# Patient Record
Sex: Female | Born: 1990 | Race: Black or African American | Hispanic: No | Marital: Single | Smoking: Never smoker
Health system: Southern US, Community
[De-identification: ages and names within clinical notes are randomized; demographics above are authoritative.]

## PROBLEM LIST (undated history)

## (undated) ENCOUNTER — Emergency Department (HOSPITAL_COMMUNITY): Discharge status: Absent without leave | Payer: Self-pay

## (undated) DIAGNOSIS — K589 Irritable bowel syndrome without diarrhea: Secondary | ICD-10-CM

## (undated) DIAGNOSIS — L309 Dermatitis, unspecified: Secondary | ICD-10-CM

## (undated) HISTORY — DX: Irritable bowel syndrome, unspecified: K58.9

## (undated) HISTORY — DX: Dermatitis, unspecified: L30.9

---
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
## 2011-03-02 LAB — URINE MICROSCOPIC-ADD ON

## 2011-03-02 LAB — URINALYSIS, ROUTINE W REFLEX MICROSCOPIC
Bilirubin Urine: NEGATIVE
Hgb urine dipstick: NEGATIVE
Ketones, ur: 15 mg/dL — AB
Nitrite: NEGATIVE
Specific Gravity, Urine: 1.034 — ABNORMAL HIGH (ref 1.005–1.030)
Urobilinogen, UA: 0.2 mg/dL (ref 0.0–1.0)

## 2011-03-02 LAB — POCT PREGNANCY, URINE: Preg Test, Ur: NEGATIVE

## 2011-04-17 NOTE — Op Note (Signed)
NAME:  Marissa Solis, Marissa Solis                          ACCOUNT NO.:  [id number]   MEDICAL RECORD NO.:  [id number]                   PATIENT TYPE:  AMB   LOCATION:  DSC                                  FACILITY:  [facility]   PHYSICIAN:  Leonia Corona, M.D.               DATE OF BIRTH:  Jun 13, 1991   DATE OF PROCEDURE:  03/15/2003  DATE OF DISCHARGE:                                 OPERATIVE REPORT   PREOPERATIVE DIAGNOSIS:  Left ear lobule cyst.   POSTOPERATIVE DIAGNOSIS:  Foreign body granuloma left ear lobule.   PROCEDURE:  Excision biopsy of left ear lobule granuloma.   ANESTHESIA:  General laryngeal mask anesthesia.   SURGEON:  Leonia Corona, M.D.   ASSISTANT:  Nurse.   INDICATIONS FOR PROCEDURE:  This 20 year old female was seen in my office  for complaints of a painful, nodular swelling of the left ear lobule which  continued to grow in size over the last few months.  There is a history of  ear piercing on that side.  Clinically, this appeared like a 1 cm growing  cyst, hence the indication for the procedure.   DESCRIPTION OF PROCEDURE:  The patient is brought into the operating room  and placed supine on the operating table.  General laryngeal mask anesthesia  is given. The left ear and the surrounding area of this space is cleaned,  prepped and draped in the usual sterile fashion.  The incision was marked at  the base of the cyst on the ear lobule.  An incision was made with knife  superficially and dissected with the help of sharp scissors, freeing the  base of the lobular swelling from the surrounding fibrous tissue.  Traction  was applied to the cyst and dissection was continued.  It appeared to be  continuing in the form of a sinus tract running from posterior surface of  the ear lobule to the anterior surface and putting traction on the whole in  the lobule, indicating sinus tract running through the granulomatous tissue.  Therefore, an elliptical incision was also mass  in the front of the ear  lobule, surrounding the hole in the ear lobule.  Once separated, the entire  tract along with the granuloma was removed from the field.  Oozing and  bleeding was controlled with gentle pressure for a few minutes.  Approximately 2 mL of 0.25% Marcaine with epinephrine was infiltrated in and  around the incision for postoperative pain control.  The wound was  closed from front to back using 6-0 Prolene interrupted sutures.  A sterile  gauze and clear tape dressing was applied. The patient tolerated the  procedure very well which was smooth and uneventful. The patient was later  extubated and transported to the recovery room in good stable condition.  Leonia Corona, M.D.    SF/MEDQ  D:  03/15/2003  T:  03/15/2003  Job:  [id number]   cc:   Sharlot Gowda, M.D.  [address] [place], [place] [address]  Fax: [phone]

## 2011-05-27 IMAGING — CR DG ABDOMEN ACUTE W/ 1V CHEST
3 series · 3 of 3 positions shown · non-contrast
Comparison: 07/04/2006

CLINICAL DATA: Nausea, vomiting, dehydration, diarrhea

ACUTE ABDOMEN SERIES (ABDOMEN 2 VIEW & CHEST 1 VIEW)

[w chest pa]
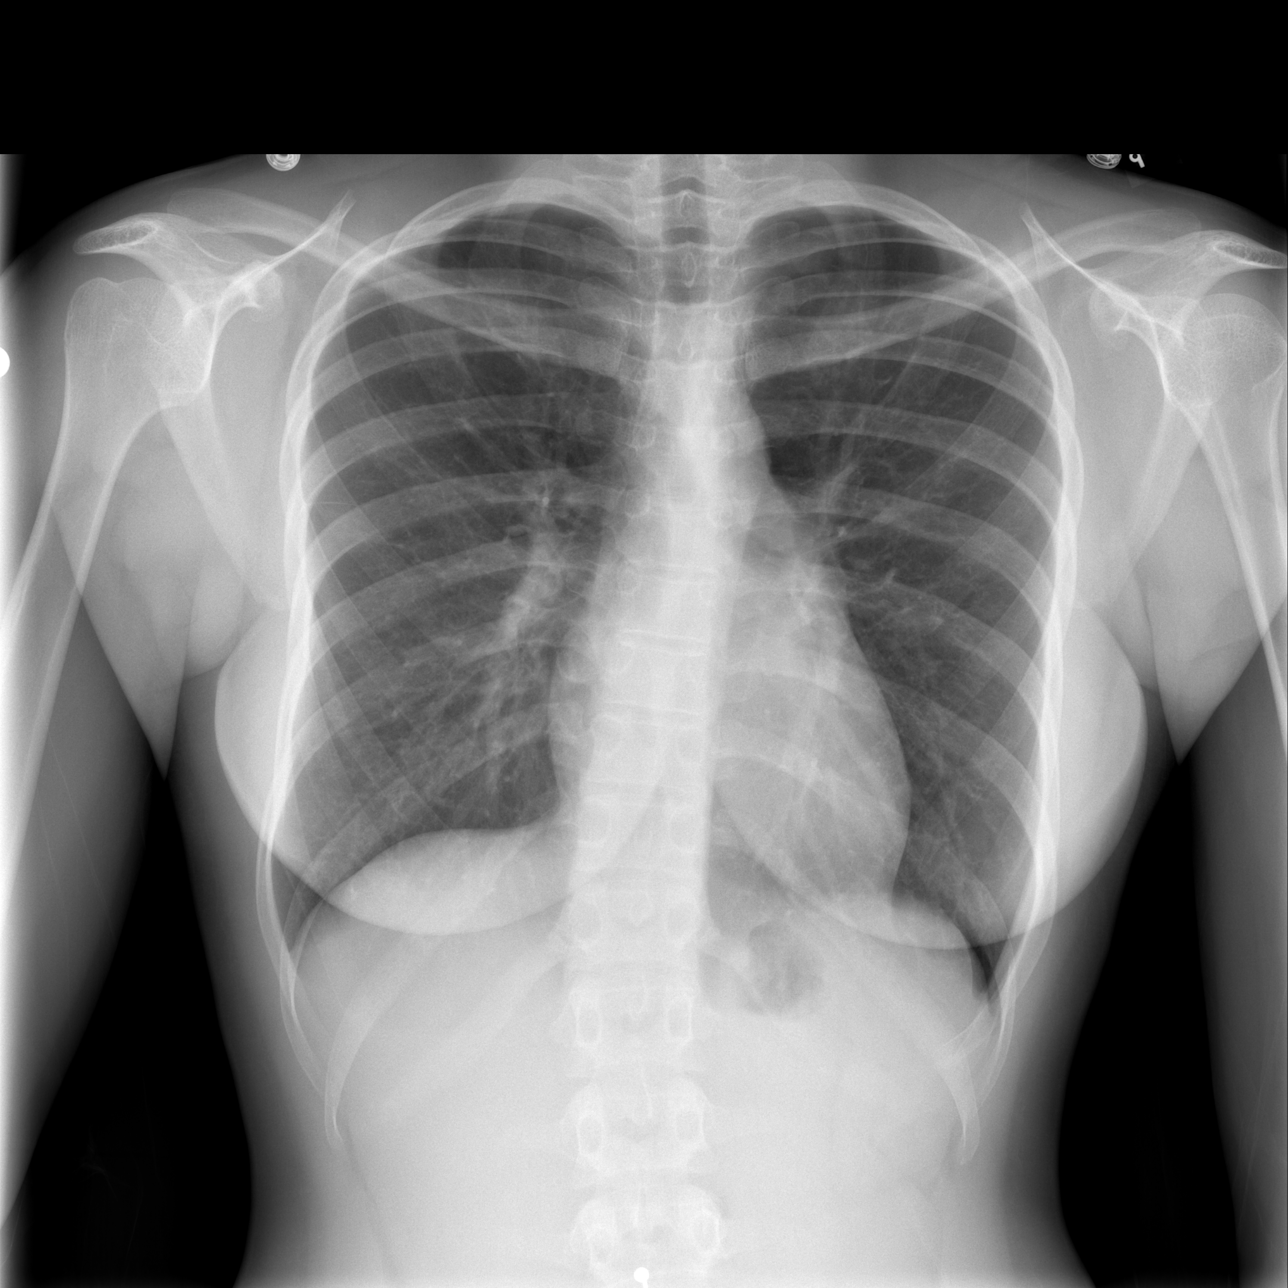

[w abdomen upright]
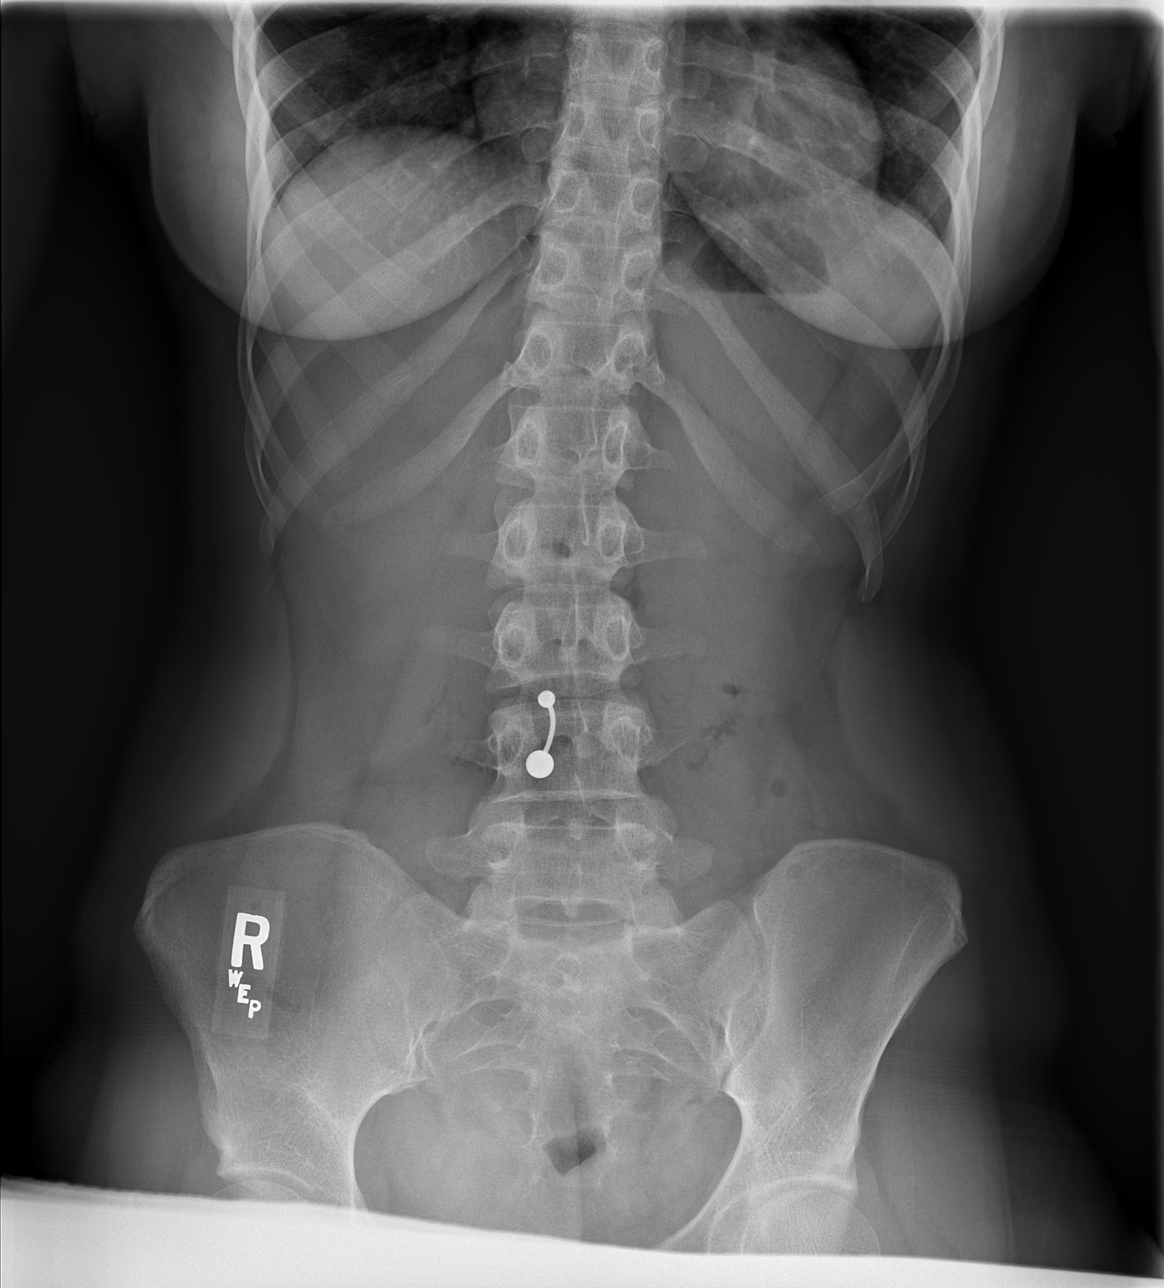

[t abdomen supine]
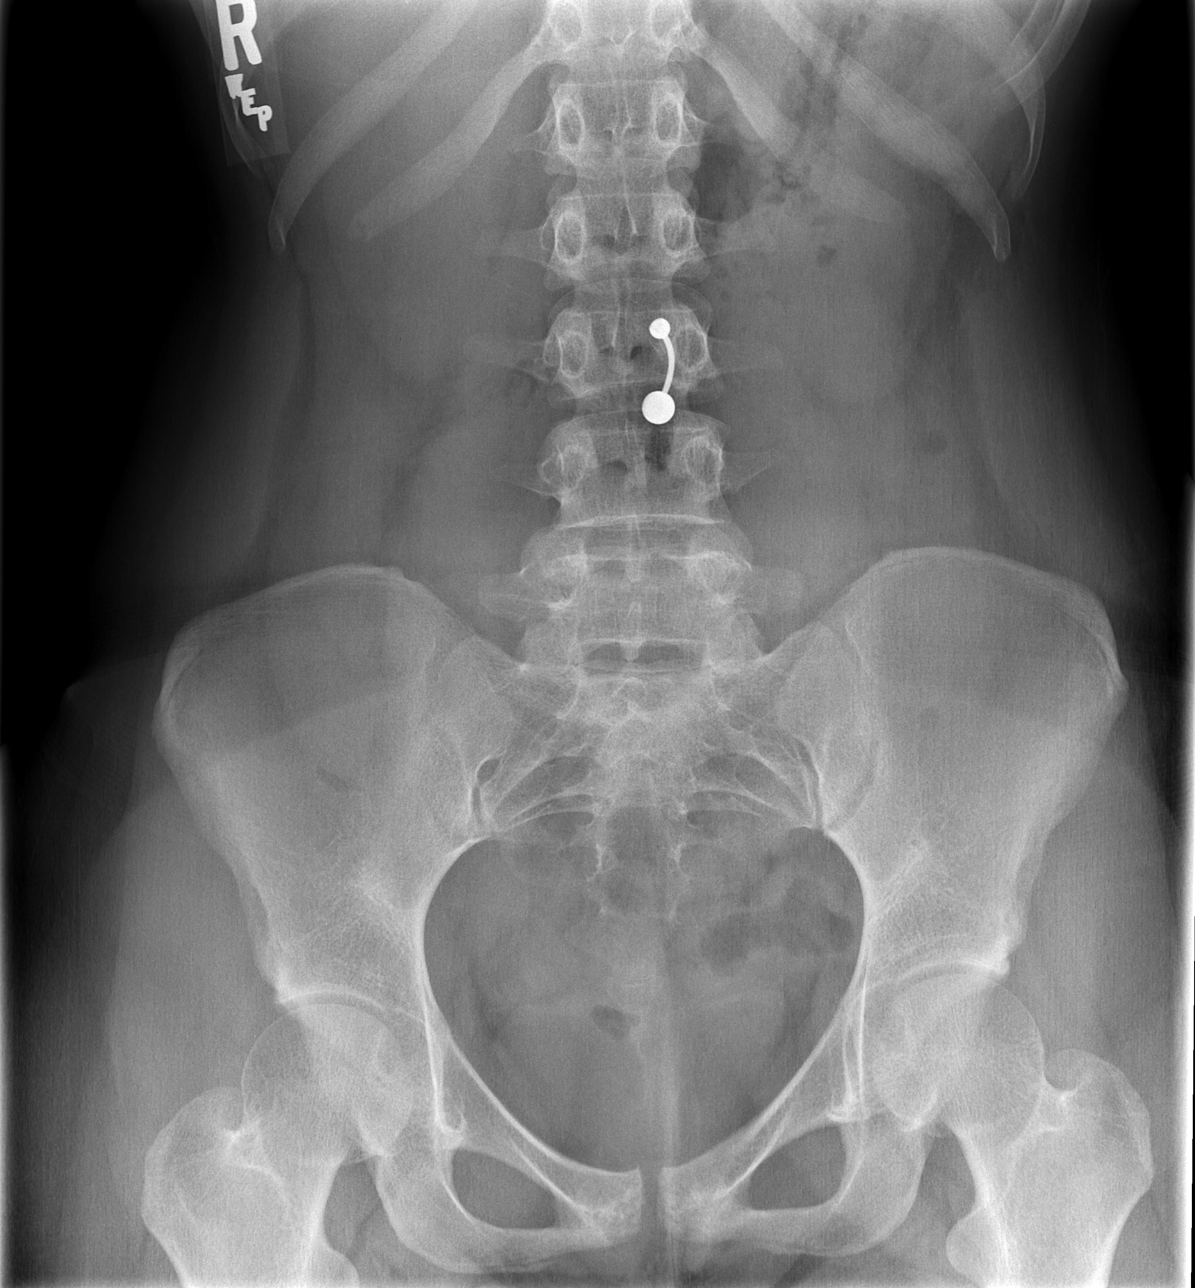

[3 of 3 positions shown; findings below may reference images not displayed]

FINDINGS: Normal heart size, mediastinal contours, and pulmonary vascularity.
Lungs clear.
Thoracolumbar scoliosis.
Jewelry at navel projects over spine on abdominal radiographs.
Benign bowel gas pattern without bowel dilatation or bowel wall
thickening.
No free intraperitoneal or urinary tract calcification.
Bones unremarkable.
IMPRESSION: No acute abnormalities.
Scoliosis.

## 2011-06-14 IMAGING — US US PELVIS COMPLETE
1 series · 14 of 25 positions shown · non-contrast
Comparison: None

CLINICAL DATA: Amenorrhea

TRANSABDOMINAL AND TRANSVAGINAL ULTRASOUND OF PELVIS
TECHNIQUE: Both transabdominal and transvaginal ultrasound
examinations of the pelvis were performed including evaluation of
the uterus, ovaries, adnexal regions, and pelvic cul-de-sac.

[Series 1: us pelvis complete · 0.21mm/px · 14 of 46 slices shown]
[im 1/46]
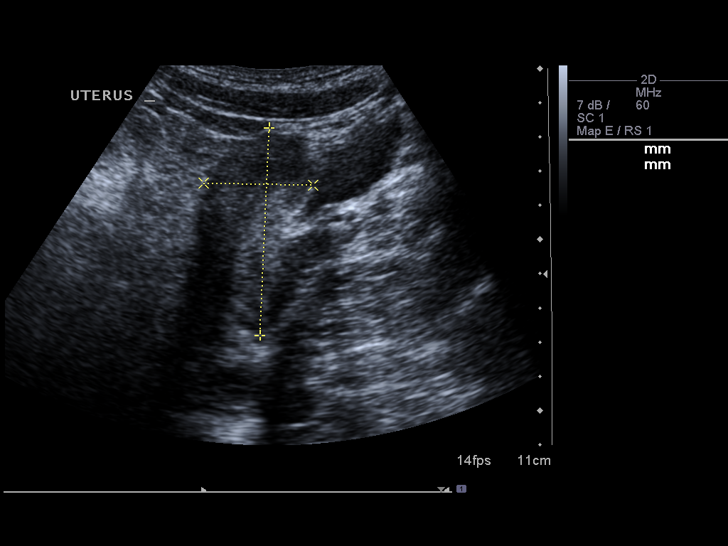
[im 4/46]
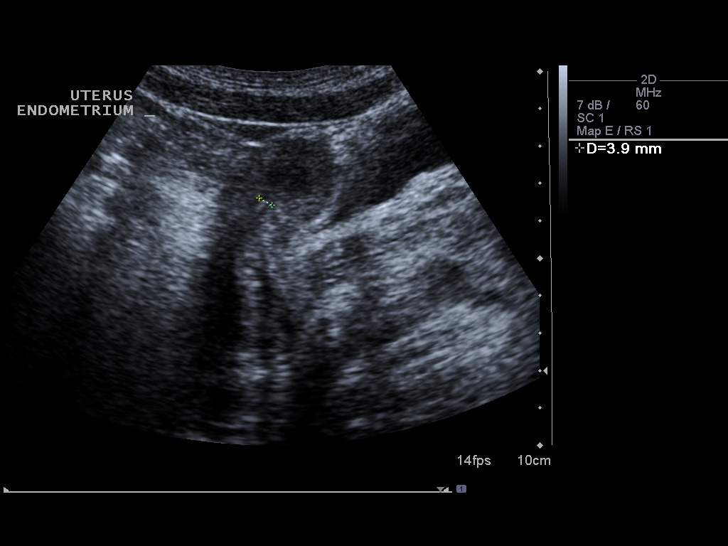
[im 8/46]
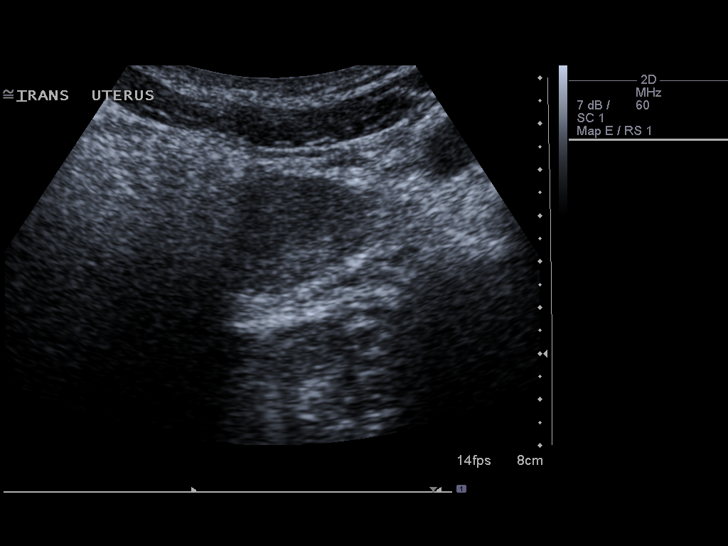
[im 12/46]
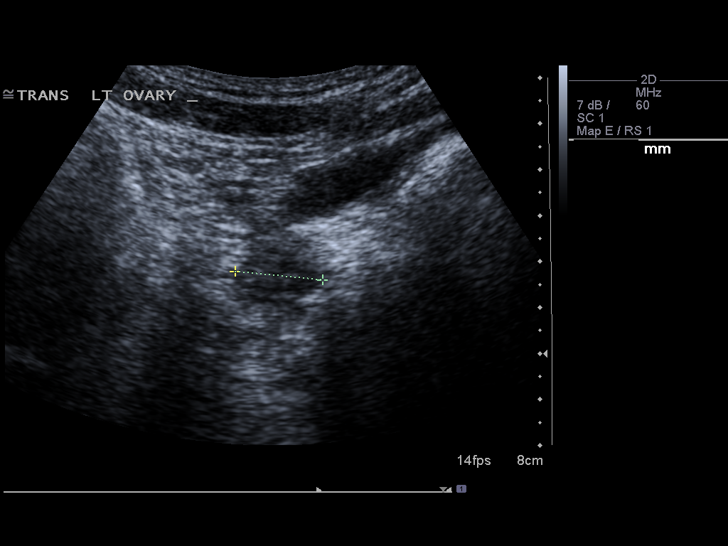
[im 16/46]
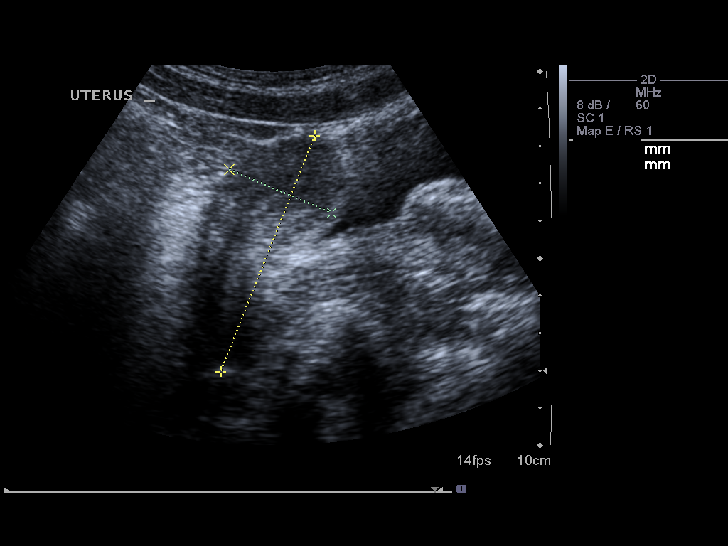
[im 17/46]
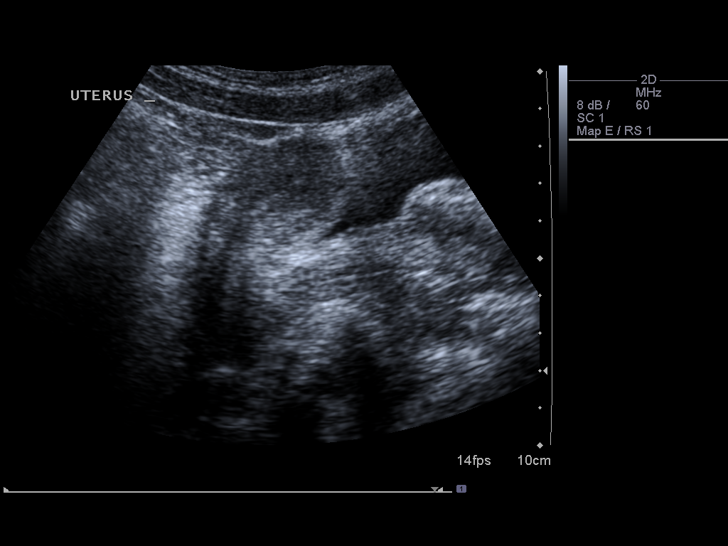
[im 21/46]
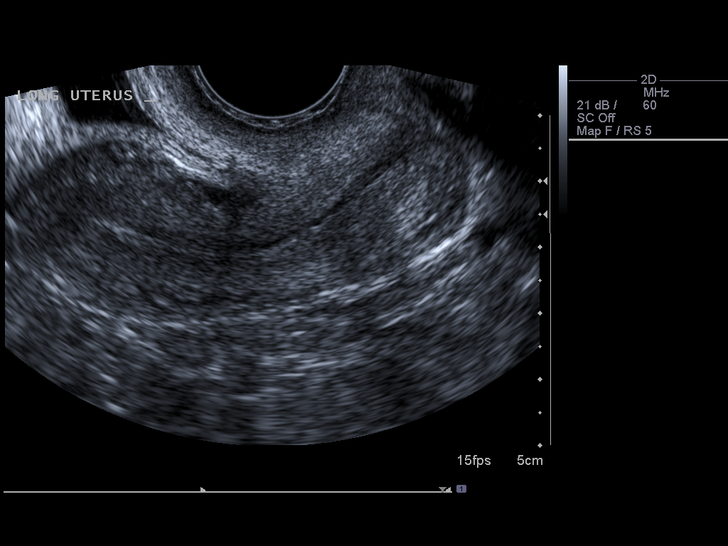
[im 25/46]
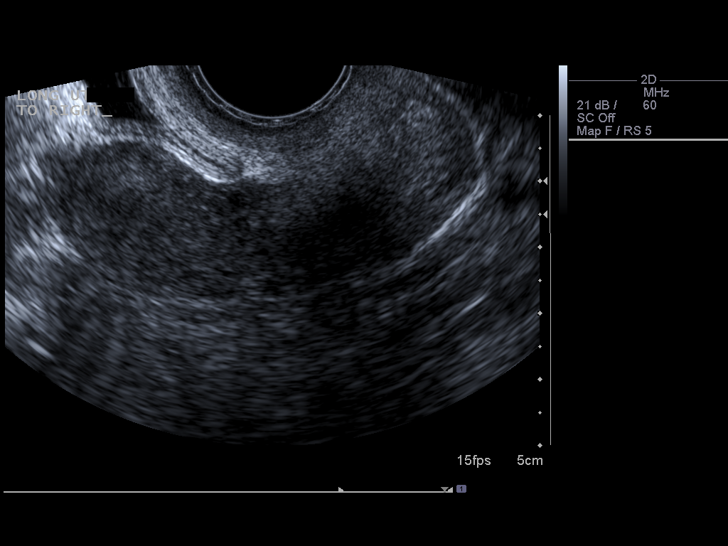
[im 29/46]
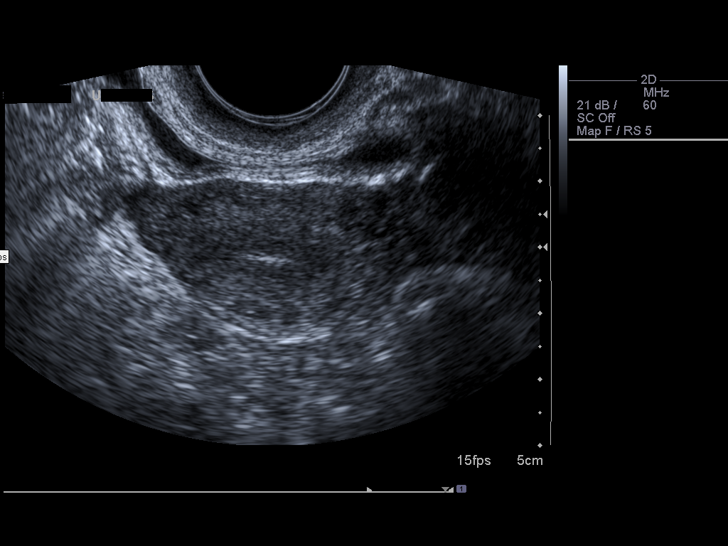
[im 31/46]
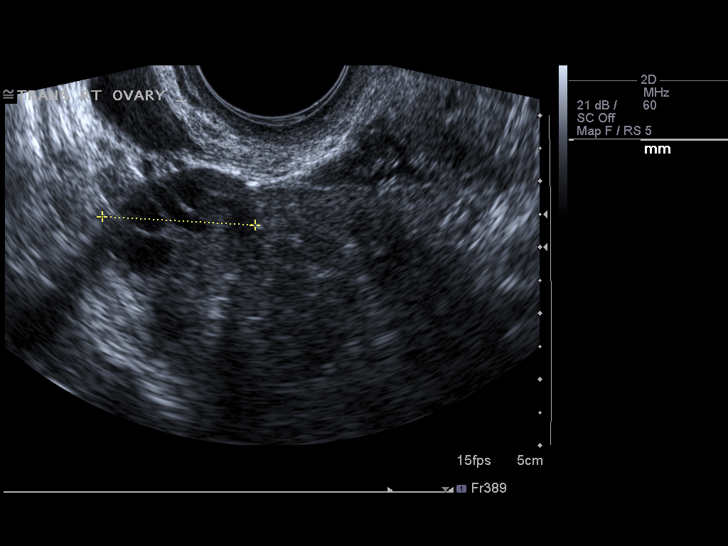
[im 34/46]
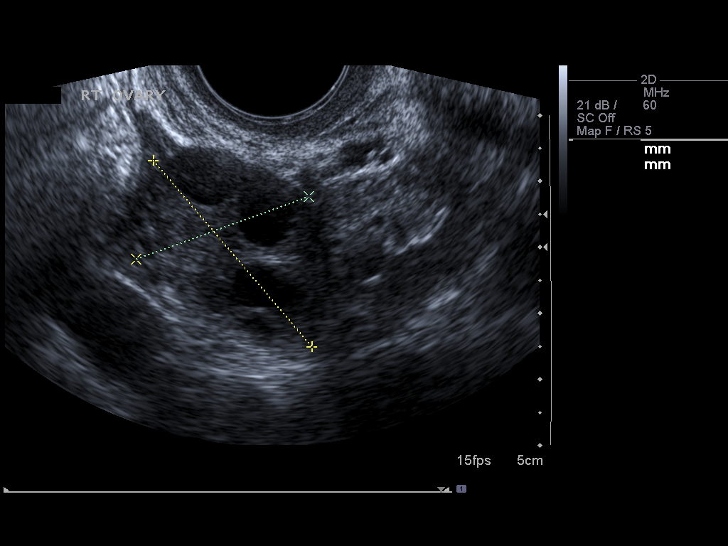
[im 38/46]
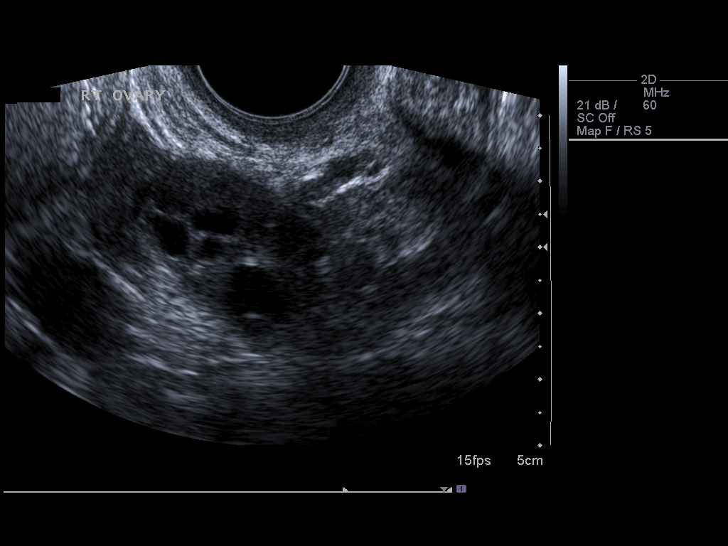
[im 42/46]
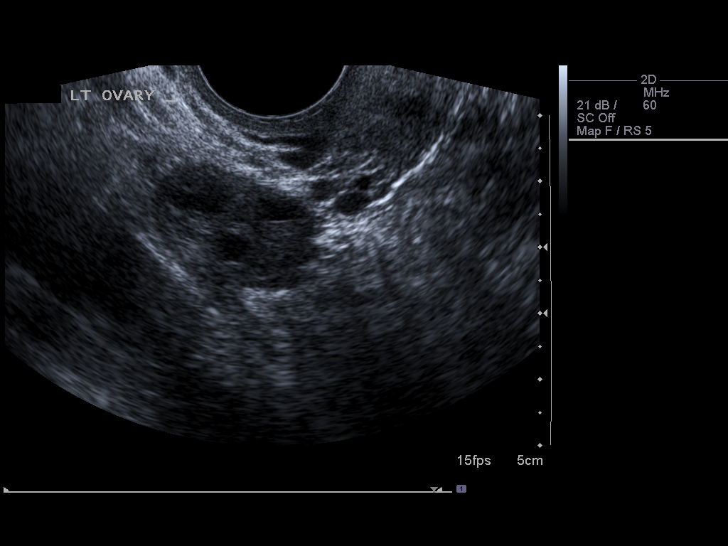
[im 46/46]
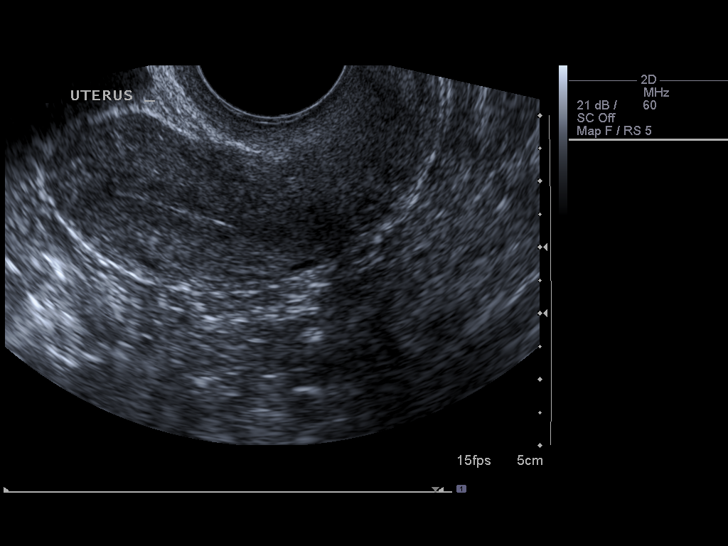

[14 of 25 positions shown; findings below may reference images not displayed]

FINDINGS: Uterus: Uterus measures 6.1 x 3.2 x 2.9 cm.  No focal abnormality.
Normal echotexture.

Endometrium: Endometrium normal in thickness at 2 mm.  No focal
abnormality.

Right Ovary: 3.7 x 2.8 x 2.3 cm. Normal size and echotexture.  No
adnexal masses.

Left Ovary: 2.7 x 1.9 x 2.3 cm. Normal size and echotexture.  No
adnexal masses.

Other Findings:  No free fluid.
IMPRESSION: Unremarkable pelvic ultrasound.

## 2011-07-09 ENCOUNTER — Encounter: Payer: Self-pay | Admitting: Family Medicine

## 2014-04-11 ENCOUNTER — Ambulatory Visit (INDEPENDENT_AMBULATORY_CARE_PROVIDER_SITE_OTHER): Payer: 59 | Admitting: Medical

## 2014-04-11 ENCOUNTER — Encounter: Payer: Self-pay | Admitting: Medical

## 2014-04-11 VITALS — BP 106/68 | HR 72 | Temp 98.1°F | Resp 18 | Wt 137.0 lb

## 2014-04-11 DIAGNOSIS — N898 Other specified noninflammatory disorders of vagina: Secondary | ICD-10-CM

## 2014-04-11 MED ORDER — METRONIDAZOLE 500 MG PO TABS: 500.0000 mg | ORAL_TABLET | Freq: Two times a day (BID) | ORAL | Status: AC

## 2014-04-11 NOTE — Progress Notes (Signed)
   Subjective:   Marissa Solis is a 23 y.o. female presenting on 04/11/2014 with new pt  New patient today.  Stays with mom here in town, but just graduated from [employer], plans to go into nursing.  She is concerned about a vaginal bacterial infection.  She notes odor and vaginal discharge, whitish creamy discharge x 2 weeks.  Has had similar prior in 11/2013.  Was treated for BV, was given flagyl.  Has had BV a few times before.  Last STD screening 10/2013 normal.  No new sexual partners since 10/2013.  Has a female sexual partner.  Is sexually active, same partner, uses condoms.   Has had yeast infection prior, but this seems different.  No recent antibiotic.  No other aggravating or relieving factors.  No other complaint.  Review of Systems ROS as in subjective      Objective:   Filed Vitals:   04/11/14 1453  BP: 106/68  Pulse: 72  Temp: 98.1 F (36.7 C)  Resp: 18    General appearance: alert, no distress, WD/WN Gyn: Normal external genitalia without lesions, vagina with normal mucosa, cervix without lesions, no cervical motion tenderness, minimal slight white abnormal vaginal discharge.  Exam chaperoned by nurse. Rectal: deferred         Assessment: Encounter Diagnosis  Name Primary?  . Vaginal discharge Yes     Plan: Few clue cells and few yeast seen on exam.  No trich.   Begin Flagyl for possible BV, will call with other labs  Marissa Solis was seen today for new pt.  Diagnoses and associated orders for this visit:  Vaginal discharge - GC/Chlamydia Probe Amp - POCT Wet Prep [facility]([place] [place])    Return pending labs.

## 2014-04-12 LAB — GC/CHLAMYDIA PROBE AMP
CT PROBE, AMP APTIMA: NEGATIVE
GC Probe RNA: NEGATIVE

## 2014-04-12 NOTE — Progress Notes (Signed)
LM to CB

## 2014-04-13 ENCOUNTER — Other Ambulatory Visit: Payer: Self-pay | Admitting: Medical

## 2014-04-13 MED ORDER — METRONIDAZOLE 0.75 % VA GEL: 1.0000 | Freq: Two times a day (BID) | VAGINAL | Status: AC

## 2019-05-30 ENCOUNTER — Other Ambulatory Visit: Payer: Self-pay

## 2019-05-30 ENCOUNTER — Ambulatory Visit (HOSPITAL_COMMUNITY)
Admission: EM | Admit: 2019-05-30 | Discharge: 2019-05-30 | Disposition: A | Discharge status: Home / Self Care | Payer: 59 | Attending: Family Medicine | Admitting: Family Medicine

## 2019-05-30 ENCOUNTER — Encounter (HOSPITAL_COMMUNITY): Payer: Self-pay

## 2019-05-30 DIAGNOSIS — R591 Generalized enlarged lymph nodes: Secondary | ICD-10-CM

## 2019-05-30 MED ORDER — AMOXICILLIN 500 MG PO CAPS: 500.0000 mg | ORAL_CAPSULE | Freq: Three times a day (TID) | ORAL | 0 refills | Status: AC

## 2019-05-30 NOTE — ED Provider Notes (Signed)
[facility]    CSN: [id number] Arrival date & time: 05/30/19  1359     History   Chief Complaint Chief Complaint  Patient presents with  . Jaw Pain    HPI Marissa Solis is a 28 y.o. female.   Patient presents today with one persistent swollen lymph node in left jaw area x2 months.  She denies sore throat, ear pain, fever, chills, vomiting, dysphagia, or other symptoms.  She states she had a crown placed on a left upper tooth 2 months ago but has no tooth pain or jaw pain.  She is leaving for 38-month assignment in [place] on Monday and has not been able to see her primary care provider.  The history is provided by the patient.    Past Medical History:  Diagnosis Date  . Contraceptive management 10/2005  . Eczema   . IBS (irritable bowel syndrome)     There are no active problems to display for this patient.   History reviewed. No pertinent surgical history.  OB History   No obstetric history on file.      Home Medications    Prior to Admission medications   Medication Sig Start Date End Date Taking? Authorizing Provider  amoxicillin (AMOXIL) 500 MG capsule Take 1 capsule (500 mg total) by mouth 3 (three) times daily. 05/30/19   Sharion Balloon, NP  metroNIDAZOLE (FLAGYL) 500 MG tablet Take 1 tablet (500 mg total) by mouth 2 (two) times daily. 04/11/14   Tysinger, Camelia Eng, PA-C  metroNIDAZOLE (METROGEL) 0.75 % vaginal gel Place 1 Applicatorful vaginally 2 (two) times daily. 04/13/14   Tysinger, Camelia Eng, PA-C    Family History Family History  Problem Relation Age of Onset  . Arthritis Mother   . Mental illness Mother   . Hypertension Maternal Grandmother   . Diabetes Maternal Grandmother   . Cancer Maternal Grandmother   . Arthritis Maternal Grandmother   . Stroke Paternal Grandmother     Social History Social History   Tobacco Use  . Smoking status: Never Smoker  . Smokeless tobacco: Never Used  Substance Use Topics  . Alcohol use: Yes   Comment: weekends only  . Drug use: No     Allergies   Patient has no known allergies.   Review of Systems Review of Systems  Constitutional: Negative for chills and fever.  HENT: Negative for dental problem, ear pain, rhinorrhea, sinus pain, sore throat and trouble swallowing.   Eyes: Negative for pain and visual disturbance.  Respiratory: Negative for cough and shortness of breath.   Cardiovascular: Negative for chest pain and palpitations.  Gastrointestinal: Negative for abdominal pain and vomiting.  Genitourinary: Negative for dysuria and hematuria.  Musculoskeletal: Negative for arthralgias and back pain.  Skin: Negative for color change and rash.  Neurological: Negative for seizures and syncope.  Hematological: Positive for adenopathy.  All other systems reviewed and are negative.    Physical Exam Triage Vital Signs ED Triage Vitals  Enc Vitals Group     BP 05/30/19 1453 118/70     Pulse Rate 05/30/19 1453 76     Resp 05/30/19 1453 16     Temp 05/30/19 1453 98.3 F (36.8 C)     Temp Source 05/30/19 1453 Oral     SpO2 05/30/19 1453 100 %     Weight 05/30/19 1454 135 lb (61.2 kg)     Height --      Head Circumference --  Peak Flow --      Pain Score 05/30/19 1454 4     Pain Loc --      Pain Edu? --      Excl. in GC? --    No data found.  Updated Vital Signs BP 118/70 (BP Location: Right Arm)   Pulse 76   Temp 98.3 F (36.8 C) (Oral)   Resp 16   Wt 135 lb (61.2 kg)   LMP 05/02/2019   SpO2 100%   Visual Acuity Right Eye Distance:   Left Eye Distance:   Bilateral Distance:    Right Eye Near:   Left Eye Near:    Bilateral Near:     Physical Exam Vitals signs and nursing note reviewed.  Constitutional:      General: She is not in acute distress.    Appearance: She is well-developed.  HENT:     Head: Normocephalic and atraumatic.     Mouth/Throat:     Mouth: Mucous membranes are moist.     Dentition: Normal dentition. No dental tenderness  or gingival swelling.     Pharynx: Oropharynx is clear. Uvula midline.  Eyes:     Conjunctiva/sclera: Conjunctivae normal.  Neck:     Musculoskeletal: Neck supple. No edema or neck rigidity.     Thyroid: No thyromegaly.     Trachea: Trachea normal.  Cardiovascular:     Rate and Rhythm: Normal rate and regular rhythm.  Pulmonary:     Effort: Pulmonary effort is normal. No respiratory distress.     Breath sounds: Normal breath sounds.  Abdominal:     Palpations: Abdomen is soft.     Tenderness: There is no abdominal tenderness.  Lymphadenopathy:     Cervical: Cervical adenopathy present.     Left cervical: Superficial cervical adenopathy present.  Skin:    General: Skin is warm and dry.  Neurological:     Mental Status: She is alert.      UC Treatments / Results  Labs (all labs ordered are listed, but only abnormal results are displayed) Labs Reviewed - No data to display  EKG   Radiology No results found.  Procedures Procedures (including critical care time)  Medications Ordered in UC Medications - No data to display  Initial Impression / Assessment and Plan / UC Course  I have reviewed the triage vital signs and the nursing notes.  Pertinent labs & imaging results that were available during my care of the patient were reviewed by me and considered in my medical decision making (see chart for details).   Consistent with left cervical adenopathy.  Treating with amoxicillin x7 days.  Follow-up with primary care provider if symptoms persist.  Discussed instructions for follow-up in urgent care or emergency department if symptoms worsen. Final Clinical Impressions(s) / UC Diagnoses   Final diagnoses:  Lymphadenopathy     Discharge Instructions     Take amoxicillin three times a day for seven days. Follow up with your primary care provider if symptoms persist.  Follow-up with urgent care or emergency department if symptoms worsen while you are out of town.     ED Prescriptions    Medication Sig Dispense Auth. Provider   amoxicillin (AMOXIL) 500 MG capsule Take 1 capsule (500 mg total) by mouth 3 (three) times daily. 21 capsule Mickie Bailate, Nicholette Dolson H, NP     Controlled Substance Prescriptions [place] Controlled Substance Registry consulted? Not Applicable   Mickie Bailate, Orean Giarratano H, NP 05/30/19 1547

## 2019-05-30 NOTE — Discharge Instructions (Signed)
Take amoxicillin three times a day for seven days. Follow up with your primary care provider if symptoms persist.  Follow-up with urgent care or emergency department if symptoms worsen while you are out of town.

## 2019-05-30 NOTE — ED Triage Notes (Signed)
Pt states she has jaw pain and swollen lymph nodes this has been going on for months.

## 2024-04-27 ENCOUNTER — Encounter: Payer: Self-pay | Admitting: Family Medicine

## 2024-04-27 ENCOUNTER — Other Ambulatory Visit: Payer: Self-pay | Admitting: Family Medicine

## 2024-04-27 DIAGNOSIS — N6310 Unspecified lump in the right breast, unspecified quadrant: Secondary | ICD-10-CM

## 2024-05-30 ENCOUNTER — Ambulatory Visit
Admission: RE | Admit: 2024-05-30 | Discharge: 2024-05-30 | Disposition: A | Discharge status: Home / Self Care | Source: Ambulatory Visit | Attending: Family Medicine | Admitting: Family Medicine

## 2024-05-30 DIAGNOSIS — N6310 Unspecified lump in the right breast, unspecified quadrant: Secondary | ICD-10-CM
